# Patient Record
Sex: Male | Born: 1995 | Race: White | Hispanic: No | Marital: Single | Smoking: Never smoker
Health system: Southern US, Community
[De-identification: ages and names within clinical notes are randomized; demographics above are authoritative.]

## PROBLEM LIST (undated history)

## (undated) DIAGNOSIS — E669 Obesity, unspecified: Secondary | ICD-10-CM

## (undated) DIAGNOSIS — J45909 Unspecified asthma, uncomplicated: Secondary | ICD-10-CM

## (undated) DIAGNOSIS — T7840XA Allergy, unspecified, initial encounter: Secondary | ICD-10-CM

## (undated) HISTORY — DX: Unspecified asthma, uncomplicated: J45.909

## (undated) HISTORY — DX: Allergy, unspecified, initial encounter: T78.40XA

## (undated) HISTORY — PX: APPENDECTOMY: SHX54

## (undated) HISTORY — DX: Obesity, unspecified: E66.9

---
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
## 2017-09-05 ENCOUNTER — Ambulatory Visit (INDEPENDENT_AMBULATORY_CARE_PROVIDER_SITE_OTHER): Payer: 59 | Admitting: Physician Assistant

## 2017-09-05 ENCOUNTER — Encounter (INDEPENDENT_AMBULATORY_CARE_PROVIDER_SITE_OTHER): Payer: Self-pay

## 2017-09-05 ENCOUNTER — Encounter: Payer: Self-pay | Admitting: Physician Assistant

## 2017-09-05 VITALS — BP 115/68 | HR 83 | Ht 69.0 in | Wt 233.0 lb

## 2017-09-05 DIAGNOSIS — E6609 Other obesity due to excess calories: Secondary | ICD-10-CM | POA: Diagnosis not present

## 2017-09-05 DIAGNOSIS — F341 Dysthymic disorder: Secondary | ICD-10-CM

## 2017-09-05 DIAGNOSIS — Z0000 Encounter for general adult medical examination without abnormal findings: Secondary | ICD-10-CM | POA: Diagnosis not present

## 2017-09-05 DIAGNOSIS — J301 Allergic rhinitis due to pollen: Secondary | ICD-10-CM | POA: Diagnosis not present

## 2017-09-05 DIAGNOSIS — J45990 Exercise induced bronchospasm: Secondary | ICD-10-CM

## 2017-09-05 DIAGNOSIS — H7402 Tympanosclerosis, left ear: Secondary | ICD-10-CM | POA: Diagnosis not present

## 2017-09-05 DIAGNOSIS — Z13220 Encounter for screening for lipoid disorders: Secondary | ICD-10-CM | POA: Diagnosis not present

## 2017-09-05 DIAGNOSIS — Z7689 Persons encountering health services in other specified circumstances: Secondary | ICD-10-CM

## 2017-09-05 DIAGNOSIS — Z130 Encounter for screening for diseases of the blood and blood-forming organs and certain disorders involving the immune mechanism: Secondary | ICD-10-CM | POA: Diagnosis not present

## 2017-09-05 DIAGNOSIS — Z6834 Body mass index (BMI) 34.0-34.9, adult: Secondary | ICD-10-CM | POA: Diagnosis not present

## 2017-09-05 DIAGNOSIS — Z131 Encounter for screening for diabetes mellitus: Secondary | ICD-10-CM

## 2017-09-05 MED ORDER — CETIRIZINE HCL 10 MG PO TABS: 10.0000 mg | ORAL_TABLET | Freq: Every day | ORAL | 3 refills | Status: AC

## 2017-09-05 MED ORDER — FLUTICASONE PROPIONATE 50 MCG/ACT NA SUSP
1.0000 | Freq: Every day | NASAL | 3 refills | Status: AC
Start: 2017-09-05 — End: ?

## 2017-09-05 MED ORDER — ALBUTEROL SULFATE HFA 108 (90 BASE) MCG/ACT IN AERS
INHALATION_SPRAY | RESPIRATORY_TRACT | 3 refills | Status: DC
Start: 2017-09-05 — End: 2018-09-18

## 2017-09-05 MED ORDER — MONTELUKAST SODIUM 10 MG PO TABS: 10.0000 mg | ORAL_TABLET | Freq: Every day | ORAL | 3 refills | Status: AC

## 2017-09-05 NOTE — Patient Instructions (Addendum)
-   start Montelukast and generic Zyrtec or Allegra at bedtime nightly  - start Flonase one spray each nostril daily - use Albuterol inhaler 2 puffs 15 minutes prior to exercise or every 4 hours as needed for wheezing/shortness of breath  For weight loss: 1800 - 2200 moderate protein diet (35% of calories from protein) DASH or Mediterranean diets are best for overall heart health Drink unsweetened, non-caffeinated beverages (preferably water 11 bottles per day) Limit red meat to 3 servings per week  Limit sugar, saturated fat and heavily processed foods

## 2017-09-05 NOTE — Progress Notes (Signed)
HPI:                                                                John Donovan is a 22 y.o. 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 Allergy   . Asthma   . Obesity    Past Surgical History:  Procedure Laterality Date  . APPENDECTOMY     Social History   Tobacco Use  . Smoking status: Never Smoker  . Smokeless tobacco: Never Used  Substance Use Topics  . Alcohol use: Never    Frequency: Never   family history includes Depression in his mother; Liver cancer in his paternal grandfather; Prostate cancer in his other; Stroke in his maternal grandfather.    ROS: Review of Systems  HENT: Positive for congestion.   All other systems reviewed and are negative.    Medications: Current Outpatient Medications  Medication Sig Dispense Refill  . albuterol (PROVENTIL HFA;VENTOLIN HFA) 108 (90 Base) MCG/ACT inhaler Inhale 2 puffs 15 minutes before physical activity 2 Inhaler 3  . cetirizine (ZYRTEC) 10 MG tablet Take 1 tablet (10 mg total) by mouth at bedtime. 90 tablet 3  . fluticasone (FLONASE) 50 MCG/ACT nasal spray Place 1 spray into both nostrils daily. 16 g 3  . montelukast (SINGULAIR) 10 MG tablet Take 1 tablet (10 mg total) by mouth at bedtime. 90 tablet 3   No current facility-administered medications for this visit.    No Known Allergies     Objective:  BP 115/68   Pulse 83   Ht  (1.753 m)   Wt 233 lb (105.7 kg)   BMI 34.41 kg/m  General  Appearance:  Alert, cooperative, no distress, appropriate for age, obese male                            Head:  Normocephalic, without obvious abnormality                             Eyes:  PERRL, EOM's intact, conjunctiva and cornea clear                             Ears:  Right TM pearly gray color and semitransparent, left TM with serous effusion and mild tympanosclerosis, external ear canals normal, both ears                            Nose:  Nares symmetrical, nasal mucosa edematous with clear rhinorrhea  Throat:  Lips, tongue, and mucosa are moist, pink, and intact; good dentition                             Neck:  Supple; symmetrical, trachea midline, no adenopathy; thyroid: no enlargement, symmetric, no tenderness/mass/nodules                             Back:  Symmetrical, no curvature, ROM normal                           Lungs:  Clear to auscultation bilaterally, respirations unlabored                             Heart:  regular rate & normal rhythm, S1 and S2 normal, no murmurs, rubs, or gallops                                  Genitourinary:  deferred         Musculoskeletal:  Tone and strength strong and symmetrical, all extremities; no joint pain or edema, normal gait and station                                     Lymphatic:  No adenopathy             Skin/Hair/Nails:  Skin warm, dry and intact, no rashes or abnormal dyspigmentation on limited exam                   Neurologic:  Alert and oriented x3, no cranial nerve deficits, DTR's intact, sensation grossly intact, normal gait and station, no tremor Psych: well-groomed, cooperative, good eye contact, euthymic mood, affect full range, speech is articulate, and thought processes clear and goal-directed    No results found for this or any previous visit (from the past 72 hour(s)). No results found.    Assessment and Plan: 22 y.o. 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 Form  signed and returned to patient. Immunizations are UTD. Declines HPV PHQ9 positive for minor depression/dysthymia. No acute safety issues. Active surveillance Counseled on weight loss through decreased caloric intake and increased aerobic exercise. Recommended 1800-2200 moderate protein diet. Fasting labs pending Asthma is well-controlled. No exacerbations in the last year. Recommend better control of allergic rhinitis with antihistamine, Flonase and leukotriene antagonist. Continue albuterol.  Encounter for annual physical exam  Encounter to establish care - Plan: CBC, Comprehensive metabolic panel, Lipid Panel w/reflex Direct LDL  Exercise-induced asthma - Plan: albuterol (PROVENTIL HFA;VENTOLIN HFA) 108 (90 Base) MCG/ACT inhaler  Non-seasonal allergic rhinitis due to pollen - Plan: montelukast (SINGULAIR) 10 MG tablet, cetirizine (ZYRTEC) 10 MG tablet, fluticasone (FLONASE) 50 MCG/ACT nasal spray  Class 1 obesity due to excess calories without serious comorbidity with body mass index (BMI) of 34.0 to 34.9 in adult  Screening for lipid disorders - Plan: Lipid Panel w/reflex Direct LDL  Screening for diabetes mellitus - Plan: Comprehensive metabolic panel  Screening for blood disease - Plan: CBC  Tympanosclerosis of left ear  Dysthymia      Patient education and anticipatory guidance given Patient agrees with treatment plan Follow-up in  1 year for CPE w/fasting labs or sooner as needed if symptoms worsen or fail to improve  Levonne Hubert PA-C

## 2017-11-14 ENCOUNTER — Other Ambulatory Visit: Payer: Self-pay | Admitting: Nurse Practitioner

## 2017-11-14 ENCOUNTER — Ambulatory Visit
Admission: RE | Admit: 2017-11-14 | Discharge: 2017-11-14 | Disposition: A | Discharge status: Home / Self Care | Payer: No Typology Code available for payment source | Source: Ambulatory Visit | Attending: Nurse Practitioner | Admitting: Nurse Practitioner

## 2017-11-14 DIAGNOSIS — Z0000 Encounter for general adult medical examination without abnormal findings: Secondary | ICD-10-CM

## 2018-09-18 ENCOUNTER — Other Ambulatory Visit: Payer: Self-pay | Admitting: Physician Assistant

## 2018-09-18 DIAGNOSIS — J45990 Exercise induced bronchospasm: Secondary | ICD-10-CM

## 2018-09-18 NOTE — Telephone Encounter (Signed)
Refill Ventolin 1 inhaler sent Due for follow-up for additional refills Pls schedule OV

## 2018-09-18 NOTE — Telephone Encounter (Signed)
Called Pt 2x. Will try again later in afternoon.

## 2019-10-01 ENCOUNTER — Emergency Department (INDEPENDENT_AMBULATORY_CARE_PROVIDER_SITE_OTHER)
Admission: EM | Admit: 2019-10-01 | Discharge: 2019-10-01 | Disposition: A | Discharge status: Home / Self Care | Payer: 59 | Source: Home / Self Care | Attending: Family Medicine | Admitting: Family Medicine

## 2019-10-01 ENCOUNTER — Emergency Department (INDEPENDENT_AMBULATORY_CARE_PROVIDER_SITE_OTHER): Payer: 59

## 2019-10-01 ENCOUNTER — Encounter: Payer: Self-pay | Admitting: Emergency Medicine

## 2019-10-01 ENCOUNTER — Other Ambulatory Visit: Payer: Self-pay

## 2019-10-01 DIAGNOSIS — R202 Paresthesia of skin: Secondary | ICD-10-CM

## 2019-10-01 DIAGNOSIS — R200 Anesthesia of skin: Secondary | ICD-10-CM

## 2019-10-01 DIAGNOSIS — M62838 Other muscle spasm: Secondary | ICD-10-CM

## 2019-10-01 DIAGNOSIS — M5412 Radiculopathy, cervical region: Secondary | ICD-10-CM | POA: Diagnosis not present

## 2019-10-01 MED ORDER — PREDNISONE 20 MG PO TABS: ORAL_TABLET | ORAL | 0 refills | Status: AC

## 2019-10-01 NOTE — ED Triage Notes (Signed)
Pt has had right sided neck pain for approx 1 week (chronic issue -comes & goes) R leg from mid thigh to toes is numb (denies pain or known injury) Tylenol 1500mg  at 0645 No ice or OTC meds for neck pain this week Pt able to flex and extend R foot No COVID  Vaccine

## 2019-10-01 NOTE — Discharge Instructions (Addendum)
Apply ice pack to back of neck for 20 to 30 minutes, 2 to 3 times daily  Continue until pain decreases.  Begin neck range of motion and stretching exercises.

## 2019-10-01 NOTE — ED Provider Notes (Signed)
John Donovan    CSN: [id number] Arrival date & time: 10/01/19  1935      History   Chief Complaint Chief Complaint  Patient presents with  . Numbness    r leg  . Neck Pain    right    HPI John Donovan is a 23 y.o. male.   Patient presents with two complaints: 1.  He has recurring episodes of right neck and trapezius area (including right upper scapula) which he attributes to tics caused by his Tourette's syndrome.  His present episode of pain has lasted about a week, longer than usual.  His pain is exacerbated by head and neck movement.  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 2.  At about 7am today he experienced a vague sense of numbness from his right mid-thigh to his right foot.  He denies loss of strength in his right leg, but has a decreased sense of proprioception in his right leg which is causing him to feel off-balance.  He denies lower back pain and pain in his right leg.  He recalls no recent injury to his back or right leg. He denies bowel or bladder dysfunction, and no saddle numbness.    The history is provided by the patient.  Neck Pain Pain location:  R side Quality:  Aching Pain radiates to:  R scapula Pain severity:  Mild Pain is:  Same all the time Onset quality:  Gradual Duration:  1 week Timing:  Constant Progression:  Unchanged Chronicity:  Recurrent Context: not fall, not MVC and not recent injury   Relieved by:  Nothing Worsened by:  Position Ineffective treatments:  NSAIDs Associated symptoms: leg pain, numbness and tingling   Associated symptoms: no bladder incontinence, no bowel incontinence, no chest pain, no fever, no headaches, no paresis, no photophobia, no syncope, no visual change, no weakness and no weight loss   Risk factors: no hx of spinal trauma     Past Medical History:  Diagnosis Date  . Allergy   . Asthma   . Obesity     Patient Active Problem List   Diagnosis Date Noted  . Class 1 obesity  due to excess calories without serious comorbidity with body mass index (BMI) of 34.0 to 34.9 in adult 09/05/2017  . Tympanosclerosis of left ear 09/05/2017  . Dysthymia 09/05/2017  . Exercise-induced asthma 04/18/2015  . Non-seasonal allergic rhinitis due to pollen 04/18/2015    Past Surgical History:  Procedure Laterality Date  . APPENDECTOMY         Home Medications    Prior to Admission medications   Medication Sig Start Date End Date Taking? Authorizing Provider  albuterol (VENTOLIN HFA) 108 (90 Base) MCG/ACT inhaler INHALE 2 PUFFS 15 MINUTES BEFORE PHYSICAL ACTIVITY 09/18/18   Carlis Stable, PA-C  cetirizine (ZYRTEC) 10 MG tablet Take 1 tablet (10 mg total) by mouth at bedtime. 09/05/17   Carlis Stable, PA-C  fluticasone [facility]) 50 MCG/ACT nasal spray Place 1 spray into both nostrils daily. 09/05/17   Carlis Stable, PA-C  montelukast (SINGULAIR) 10 MG tablet Take 1 tablet (10 mg total) by mouth at bedtime. 09/05/17   Carlis Stable, PA-C  predniSONE (DELTASONE) 20 MG tablet Take one tab by mouth twice daily for 4 days, then one daily for 3 days. Take with food. 10/01/19   Lattie Haw, MD    Family History Family History  Problem Relation Age of Onset  . Stroke  Maternal Grandfather   . Depression Mother   . Liver cancer Paternal Grandfather   . Prostate cancer Other     Social History Social History   Tobacco Use  . Smoking status: Never Smoker  . Smokeless tobacco: Never Used  Substance Use Topics  . Alcohol use: Never  . Drug use: Never     Allergies   Patient has no known allergies.   Review of Systems Review of Systems  Constitutional: Negative for activity change, appetite change, chills, diaphoresis, fatigue, fever, unexpected weight change and weight loss.  HENT: Negative.   Eyes: Negative for photophobia and visual disturbance.  Respiratory: Negative.   Cardiovascular: Negative for chest pain  and syncope.  Gastrointestinal: Negative.  Negative for bowel incontinence.  Genitourinary: Negative.  Negative for bladder incontinence.  Musculoskeletal: Positive for gait problem and neck pain. Negative for back pain, joint swelling, myalgias and neck stiffness.  Skin: Negative for rash.  Neurological: Positive for tingling and numbness. Negative for dizziness, tremors, seizures, syncope, facial asymmetry, speech difficulty, weakness, light-headedness and headaches.     Physical Exam Triage Vital Signs ED Triage Vitals  Enc Vitals Group     BP 10/01/19 1955 (!) 157/98     Pulse Rate 10/01/19 1955 98     Resp 10/01/19 1955 18     Temp 10/01/19 1955 99 F (37.2 C)     Temp Source 10/01/19 1955 Oral     SpO2 10/01/19 1955 99 %     Weight 10/01/19 1954 271 lb (122.9 kg)     Height 10/01/19 1954 5\' 9"  (1.753 m)     Head Circumference --      Peak Flow --      Pain Score 10/01/19 1955 0     Pain Loc --      Pain Edu? --      Excl. in [place]? --    No data found.  Updated Vital Signs BP (!) 147/89 (BP Location: Right Arm)   Pulse 89   Temp 99 F (37.2 C) (Oral)   Resp 18   Ht 5\' 9"  (1.753 m)   Wt 122.9 kg   SpO2 99%   BMI 40.02 kg/m   Visual Acuity Right Eye Distance:   Left Eye Distance:   Bilateral Distance:    Right Eye Near:   Left Eye Near:    Bilateral Near:     Physical Exam Vitals and nursing note reviewed.  Constitutional:      General: He is not in acute distress.    Appearance: He is not ill-appearing.  HENT:     Head: Normocephalic.     Right Ear: External ear normal.     Left Ear: External ear normal.     Nose: Nose normal.     Mouth/Throat:     Pharynx: Oropharynx is clear.  Eyes:     Conjunctiva/sclera: Conjunctivae normal.     Pupils: Pupils are equal, round, and reactive to light.  Neck:      Comments: Patient describes pain in his right lateral neck and trapezius area, with mild mid-line posterior neck tenderness.   His neck/trapezius  pain is exacerbated by rotation of his head to the right, left lateral flexion, and forward flexion. Cardiovascular:     Rate and Rhythm: Regular rhythm.     Pulses: Normal pulses.     Heart sounds: Normal heart sounds.  Pulmonary:     Breath sounds: Normal breath sounds.  Abdominal:  Tenderness: There is no abdominal tenderness.  Musculoskeletal:     Cervical back: Normal range of motion and neck supple. Tenderness present.     Right lower leg: No edema.     Left lower leg: No edema.       Legs:     Comments: Patient experiences vague sensation of numbness in his right leg below mid-thigh.   Back has no tenderness to palpation. Patellar and achilles reflexes are normal.  He exhibits a slightly ataxic gait involving his right lower extremity.   Lymphadenopathy:     Cervical: No cervical adenopathy.  Skin:    General: Skin is warm and dry.     Findings: No rash.  Neurological:     Mental Status: He is alert and oriented to person, place, and time.     Gait: Gait abnormal.     Deep Tendon Reflexes: Reflexes normal.      UC Treatments / Results  Labs (all labs ordered are listed, but only abnormal results are displayed) Labs Reviewed - No data to display  EKG   Radiology DG Cervical Spine Complete  Result Date: 10/01/2019 CLINICAL DATA:  Paresthesia in the legs EXAM: CERVICAL SPINE - COMPLETE 4+ VIEW COMPARISON:  None. FINDINGS: Mild reversal of cervical lordosis. Suboptimal visualization of cervicothoracic junction. Upper normal prevertebral soft tissues. Dens and lateral masses are within normal limits. IMPRESSION: Mild reversal of cervical lordosis.  Otherwise negative. Electronically Signed   By: Jasmine Pang M.D.   On: 10/01/2019 20:55   DG Lumbar Spine Complete  Result Date: 10/01/2019 CLINICAL DATA:  Paresthesia in the legs EXAM: LUMBAR SPINE - COMPLETE 4+ VIEW COMPARISON:  None. FINDINGS: There is no evidence of lumbar spine fracture. Alignment is normal.  Intervertebral disc spaces are maintained. IMPRESSION: Negative. Electronically Signed   By: Jasmine Pang M.D.   On: 10/01/2019 20:53    Procedures Procedures (including critical Donovan time)  Medications Ordered in UC Medications - No data to display  Initial Impression / Assessment and Plan / UC Course  I have reviewed the triage vital signs and the nursing notes.  Pertinent labs & imaging results that were available during my Donovan of the patient were reviewed by me and considered in my medical decision making (see chart for details).    Neck muscle spasm probably cause of paresthesias right neck and trapezius area.  Begin prednisone burst/taper. Decreased sensation and loss of proprioception in right leg of concern; etiology not obvious.  Will refer to neurologist for evaluation.  Final Clinical Impressions(s) / UC Diagnoses   Final diagnoses:  Right cervical radiculopathy  Right leg numbness  Neck muscle spasm     Discharge Instructions     Apply ice pack to back of neck for 20 to 30 minutes, 2 to 3 times daily  Continue until pain decreases.  Begin neck range of motion and stretching exercises.    ED Prescriptions    Medication Sig Dispense Auth. Provider   predniSONE (DELTASONE) 20 MG tablet Take one tab by mouth twice daily for 4 days, then one daily for 3 days. Take with food. 11 tablet Lattie Haw, MD        Lattie Haw, MD 10/02/19 [phone]

## 2019-10-02 ENCOUNTER — Telehealth: Payer: Self-pay

## 2019-10-02 NOTE — Telephone Encounter (Signed)
Called patient as requested by MD to assist patient in making a neuro follow up with [facility]. Patient states he will call the center to schedule an appointment that best fits his personal schedule next week. Patient added that he does not feel any better than when he was seen last night, but his symptoms have not worsened. Patient asked about obtaining a referral to a specialist, and advised that he may need to get a referral from his PCP for the neuro specialist. Pt will call the UC back if he has difficulty scheduling a neuro follow-up appt for next week.

## 2021-03-05 IMAGING — DX DG LUMBAR SPINE COMPLETE 4+V
5 series · 5 of 5 positions shown · non-contrast
Comparison: None.

CLINICAL DATA: Paresthesia in the legs

EXAM:
LUMBAR SPINE - COMPLETE 4+ VIEW

[l-spine ap]
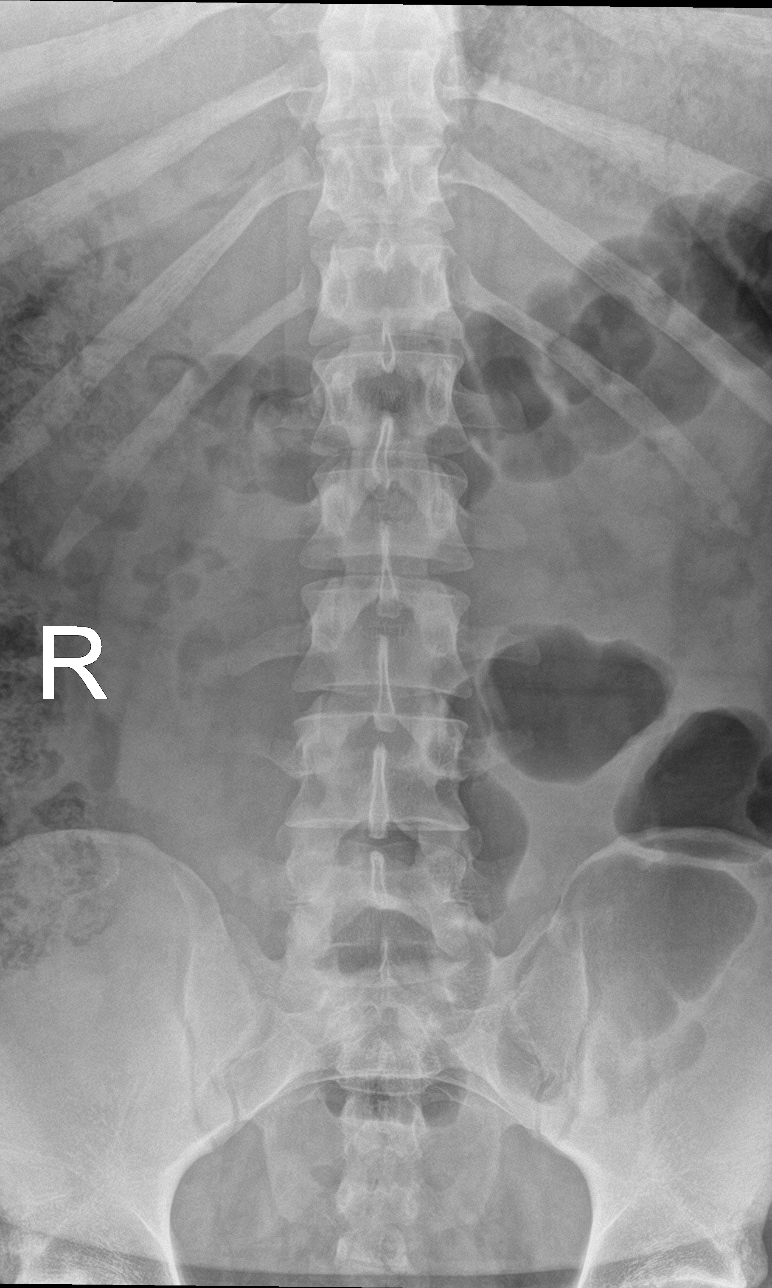

[l-spine obl (1 of 2)]
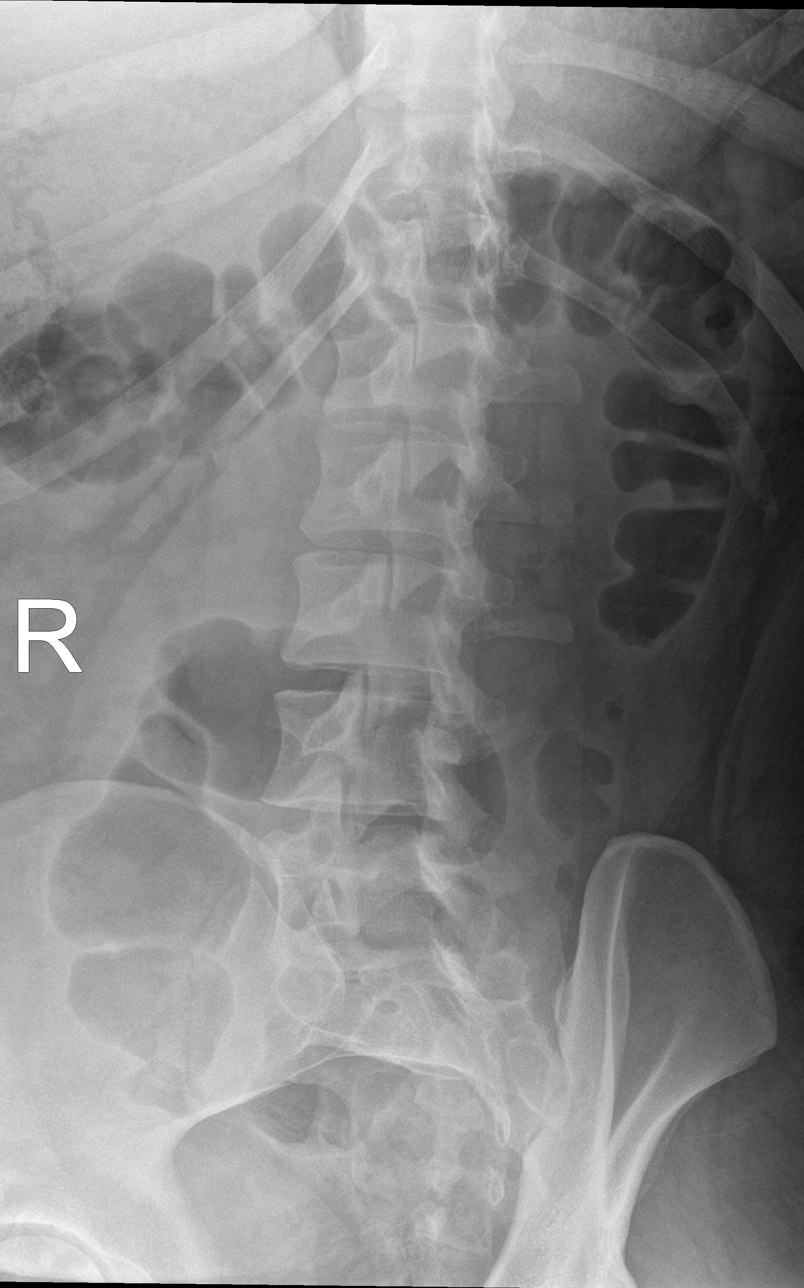

[l-spine obl (2 of 2)]
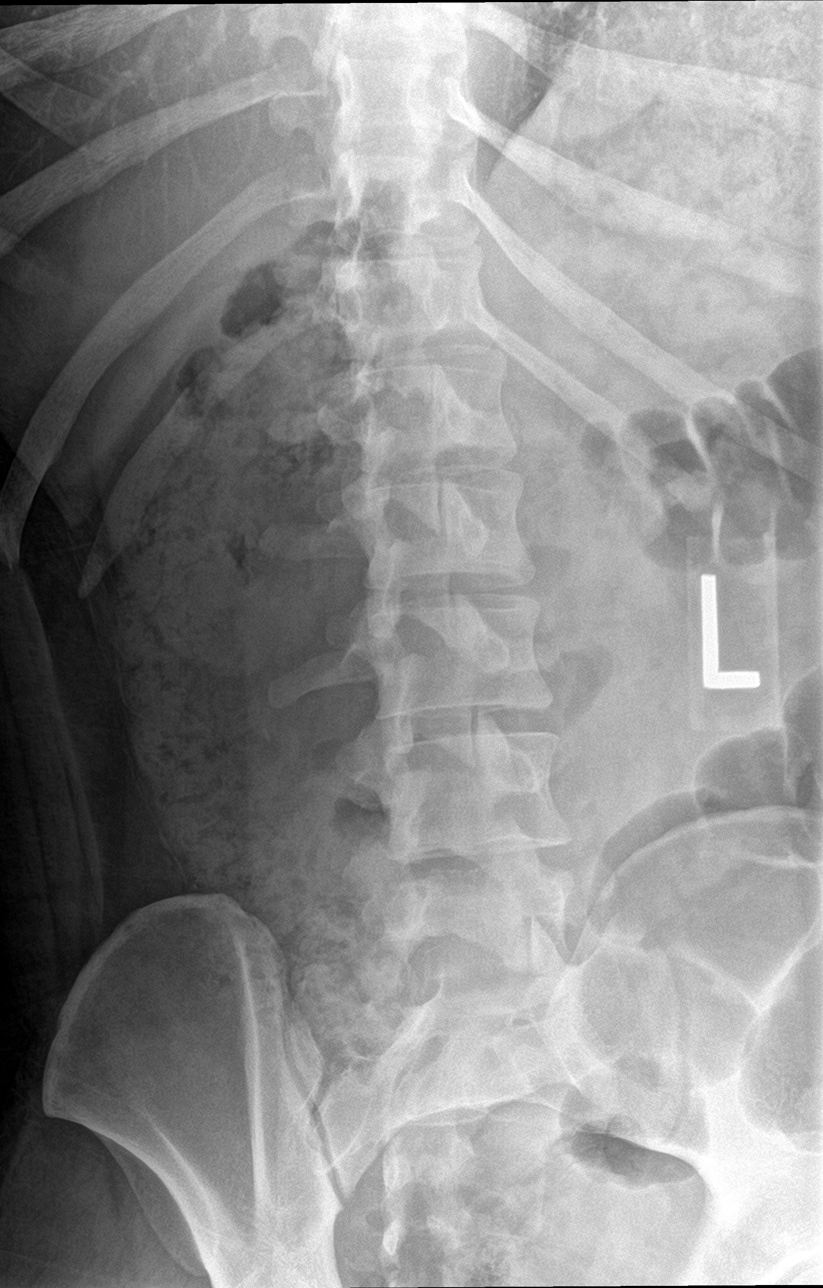

[l-spine lat]
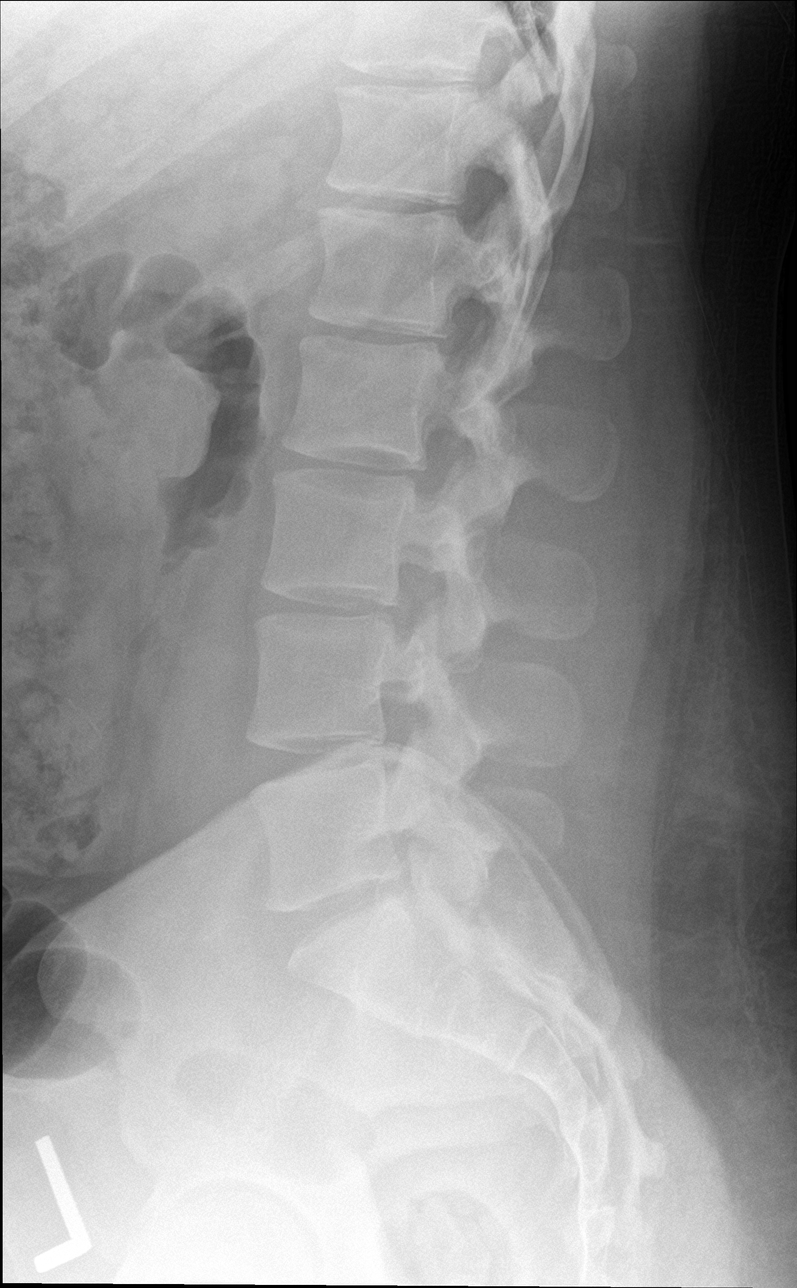

[l-spine spot]
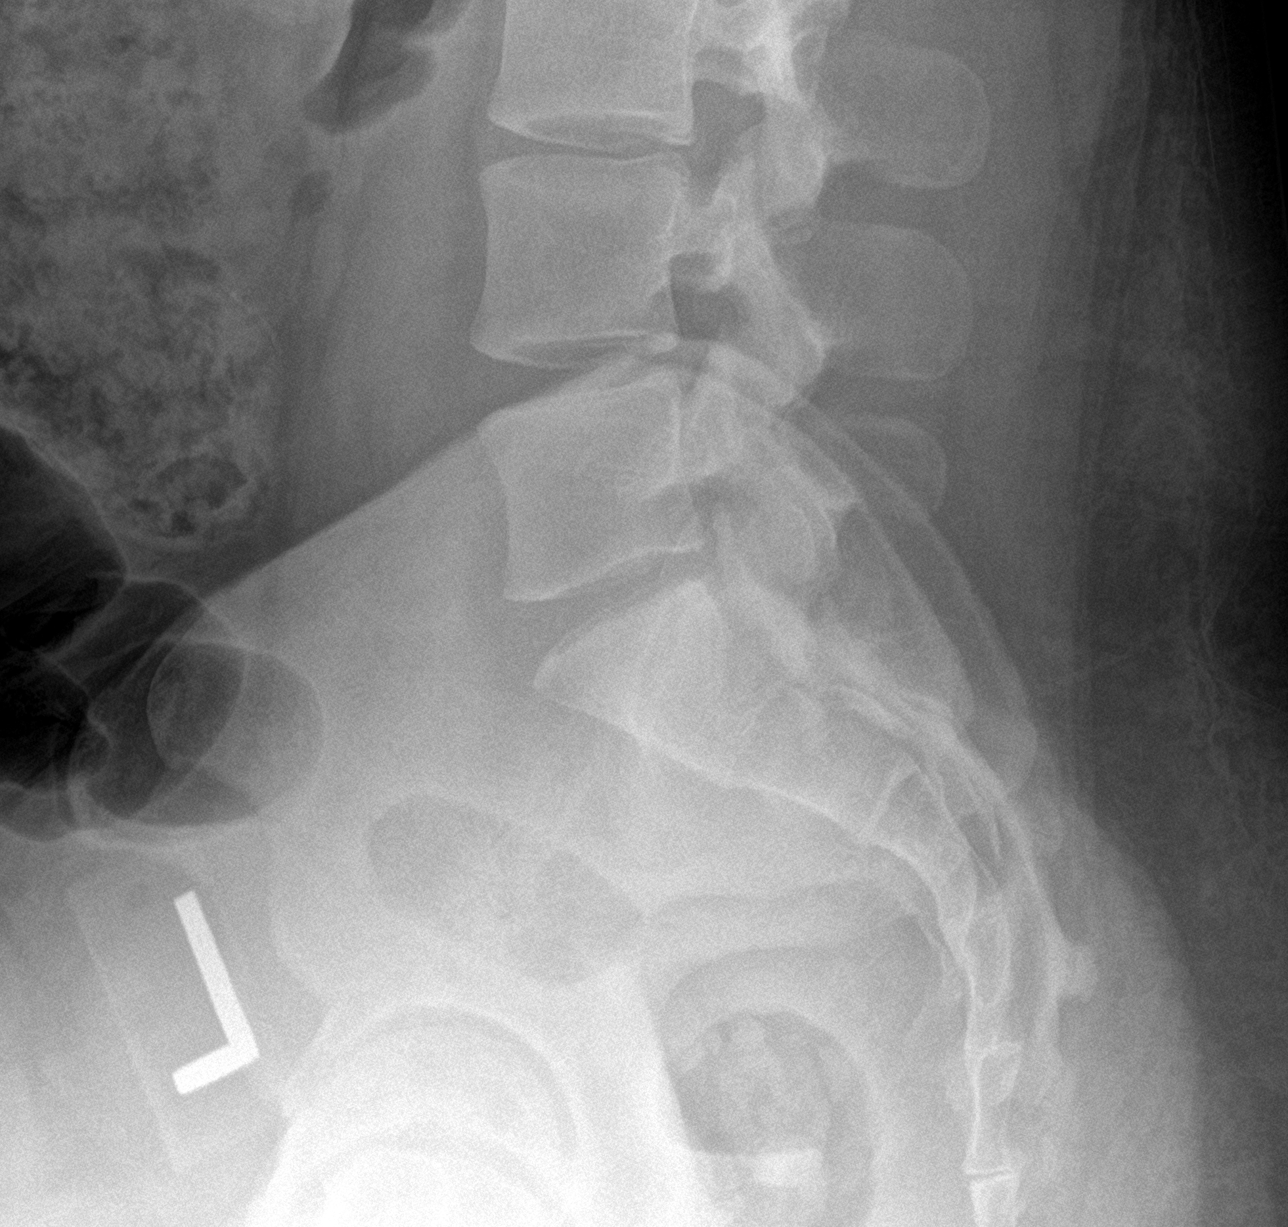

[5 of 5 positions shown; findings below may reference images not displayed]

FINDINGS: There is no evidence of lumbar spine fracture. Alignment is normal.
Intervertebral disc spaces are maintained.
IMPRESSION: Negative.
